# Patient Record
Sex: Female | Born: 1970 | Race: White | Hispanic: No | Marital: Married | Smoking: Never smoker
Health system: Southern US, Community
[De-identification: ages and names within clinical notes are randomized; demographics above are authoritative.]

## PROBLEM LIST (undated history)

## (undated) HISTORY — PX: ABDOMINAL HYSTERECTOMY: SHX81

---

## 1998-03-05 ENCOUNTER — Other Ambulatory Visit: Admission: RE | Admit: 1998-03-05 | Discharge: 1998-03-05 | Payer: Self-pay | Admitting: Gynecology

## 2000-03-21 ENCOUNTER — Other Ambulatory Visit: Admission: RE | Admit: 2000-03-21 | Discharge: 2000-03-21 | Payer: Self-pay | Admitting: Specialist

## 2010-09-20 ENCOUNTER — Encounter: Payer: Self-pay | Admitting: *Deleted

## 2010-09-20 ENCOUNTER — Emergency Department (HOSPITAL_BASED_OUTPATIENT_CLINIC_OR_DEPARTMENT_OTHER)
Admission: EM | Admit: 2010-09-20 | Discharge: 2010-09-20 | Disposition: A | Discharge status: Home / Self Care | Payer: BC Managed Care – PPO | Attending: Emergency Medicine | Admitting: Emergency Medicine

## 2010-09-20 ENCOUNTER — Emergency Department (INDEPENDENT_AMBULATORY_CARE_PROVIDER_SITE_OTHER): Payer: BC Managed Care – PPO

## 2010-09-20 DIAGNOSIS — W19XXXA Unspecified fall, initial encounter: Secondary | ICD-10-CM

## 2010-09-20 DIAGNOSIS — S80219A Abrasion, unspecified knee, initial encounter: Secondary | ICD-10-CM

## 2010-09-20 DIAGNOSIS — W010XXA Fall on same level from slipping, tripping and stumbling without subsequent striking against object, initial encounter: Secondary | ICD-10-CM | POA: Insufficient documentation

## 2010-09-20 DIAGNOSIS — S8000XA Contusion of unspecified knee, initial encounter: Secondary | ICD-10-CM | POA: Insufficient documentation

## 2010-09-20 DIAGNOSIS — M25569 Pain in unspecified knee: Secondary | ICD-10-CM

## 2010-09-20 DIAGNOSIS — IMO0002 Reserved for concepts with insufficient information to code with codable children: Secondary | ICD-10-CM | POA: Insufficient documentation

## 2010-09-20 MED ORDER — HYDROCODONE-ACETAMINOPHEN 5-500 MG PO TABS: 1.0000 | ORAL_TABLET | Freq: Four times a day (QID) | ORAL | Status: AC | PRN

## 2010-09-20 MED ORDER — TETANUS-DIPHTH-ACELL PERTUSSIS 5-2.5-18.5 LF-MCG/0.5 IM SUSP
0.5000 mL | Freq: Once | INTRAMUSCULAR | Status: AC
  Administered 2010-09-20: 0.5 mL via INTRAMUSCULAR
  Filled 2010-09-20: qty 0.5

## 2010-09-20 NOTE — ED Notes (Signed)
Last pm she slipped and fell on concrete. Inj to her right knee. Abrasion noted. Pain is worse the more she tries to walk.

## 2010-09-20 NOTE — ED Provider Notes (Signed)
History     CSN: [id number] Arrival date & time: 09/20/2010  3:35 PM  Chief Complaint  Patient presents with  . Fall   HPI Comments: Slipped on concrete pavers and injured knee.    Patient is a 40 y.o. female presenting with fall. The history is provided by the patient.  Fall The accident occurred yesterday. The fall occurred while walking. She fell from a height of 1 to 2 ft. She landed on concrete. The point of impact was the right knee. The pain is at a severity of 8/10. The pain is moderate. She was ambulatory at the scene. There was no drug use involved in the accident. Pertinent negatives include no fever.    History reviewed. No pertinent past medical history.  Past Surgical History  Procedure Date  . Abdominal hysterectomy     No family history on file.  History  Substance Use Topics  . Smoking status: Never Smoker   . Smokeless tobacco: Not on file  . Alcohol Use: No    OB History    Grav Para Term Preterm Abortions TAB SAB Ect Mult Living                  Review of Systems  Constitutional: Negative for fever and chills.  Musculoskeletal:       As above.  Skin:       Abrasion to right knee.  All other systems reviewed and are negative.    Physical Exam  BP 132/78  Pulse 78  Temp(Src) 98 F (36.7 C) (Oral)  Resp 22  SpO2 99%  Physical Exam  Constitutional: She is oriented to person, place, and time. She appears well-developed and well-nourished.  HENT:  Head: Normocephalic and atraumatic.  Neck: Normal range of motion. Neck supple.  Musculoskeletal:       The right knee is noted to have an abrasion and swelling and ttp to the patella.  Seems stable ap,lat.  Neurological: She is alert and oriented to person, place, and time.  Skin:       Abrasion to the right knee.    ED Course  Procedures  MDM xrays okay.  Will discharge with time, pain meds.      Geoffery Lyons, MD 09/20/10 [phone]

## 2012-11-29 IMAGING — CR DG KNEE COMPLETE 4+V*R*
4 series · 4 of 4 positions shown · non-contrast
Comparison: None

CLINICAL DATA: Fell.  Right knee pain.

RIGHT KNEE - COMPLETE 4+ VIEW

[t knee ap right]
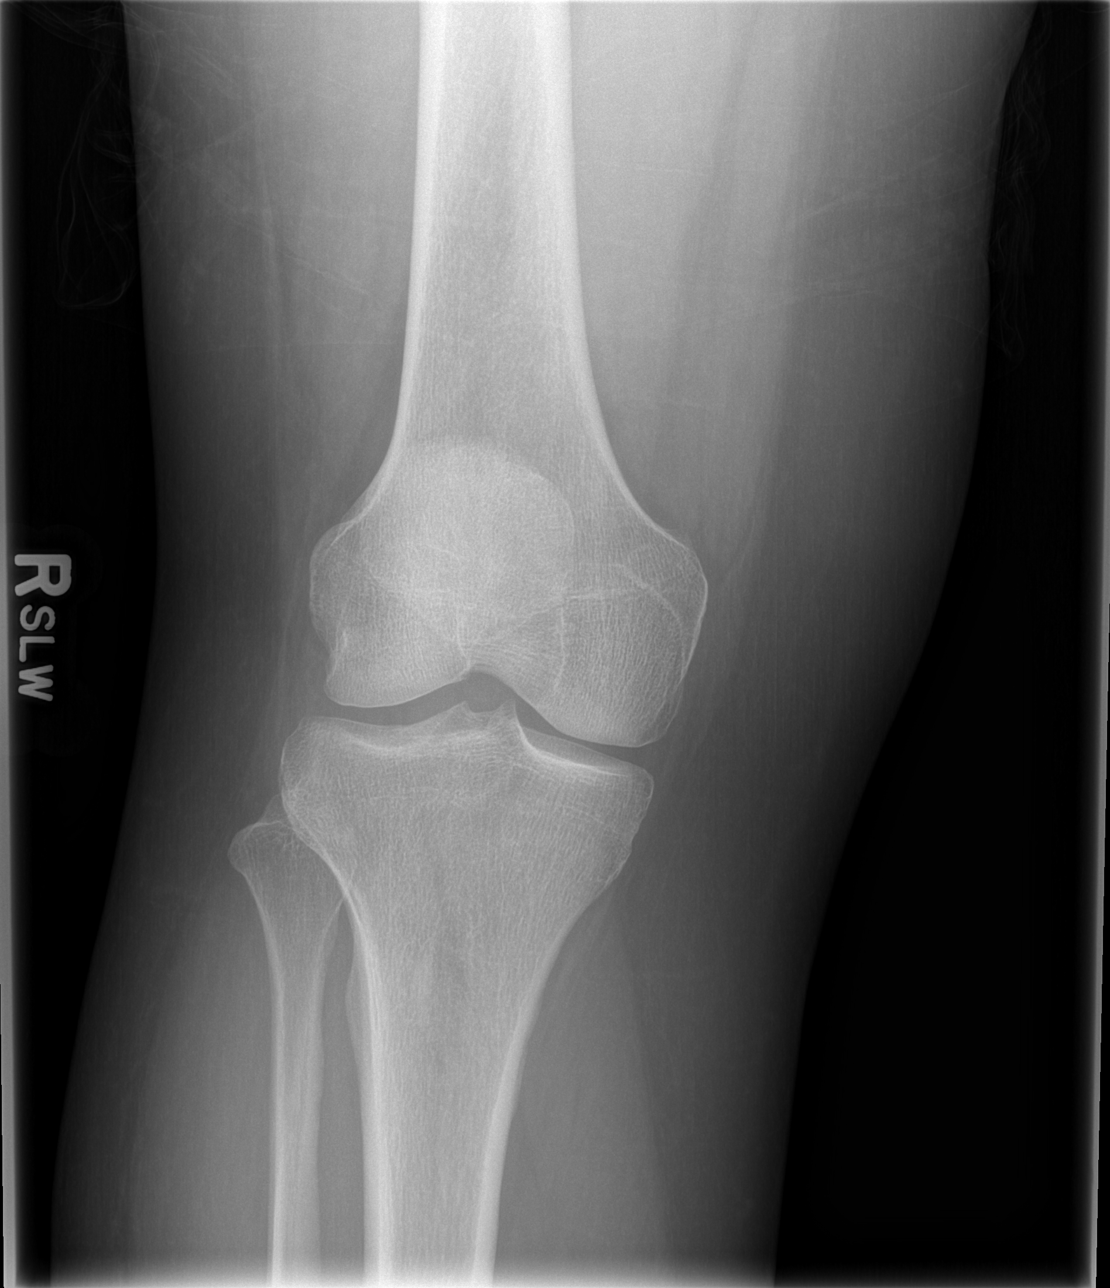

[t knee oblique right (1 of 2)]
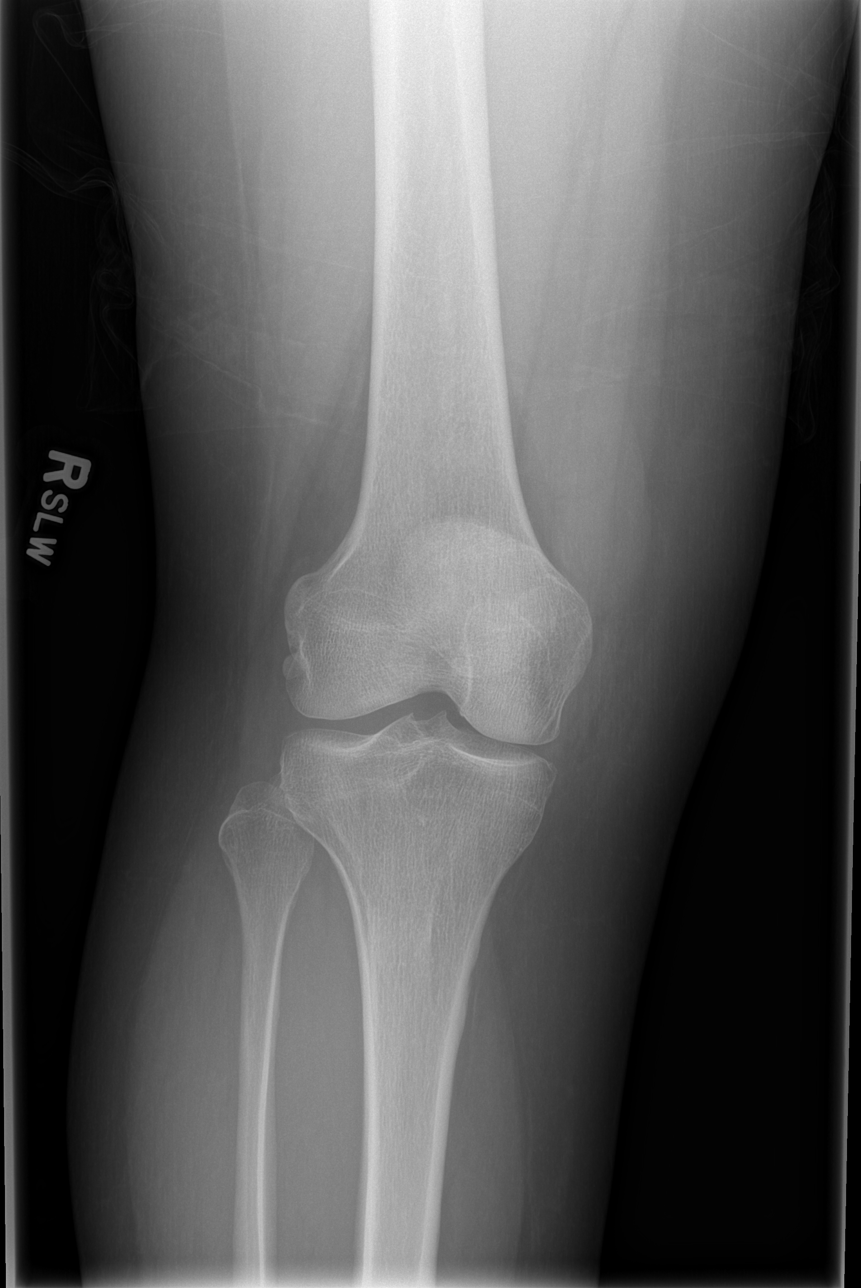

[t knee oblique right (2 of 2)]
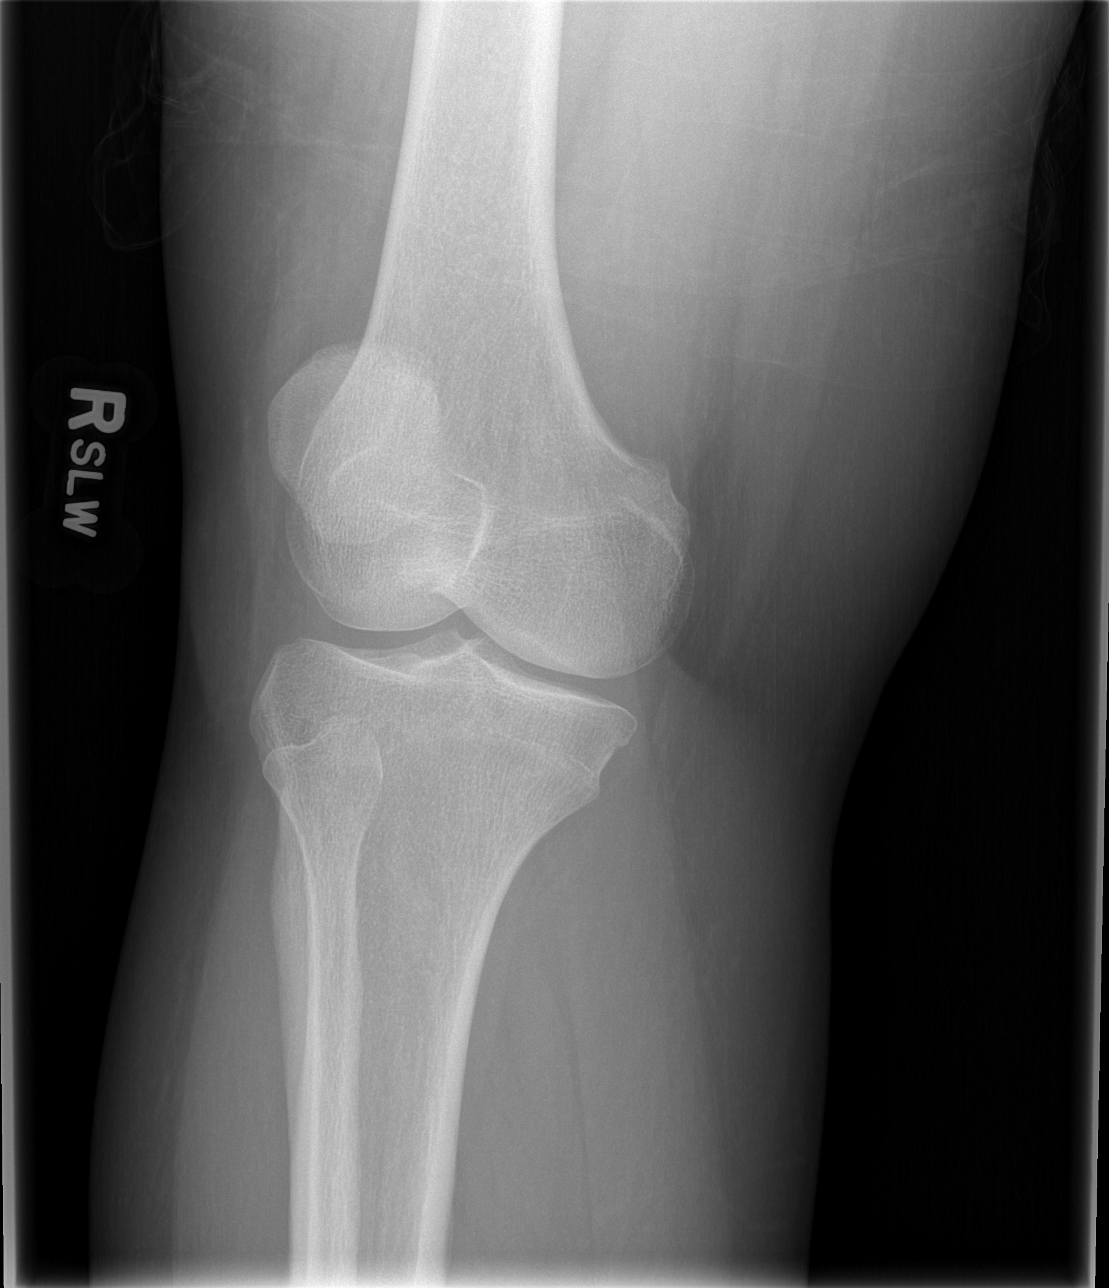

[t knee lat right]
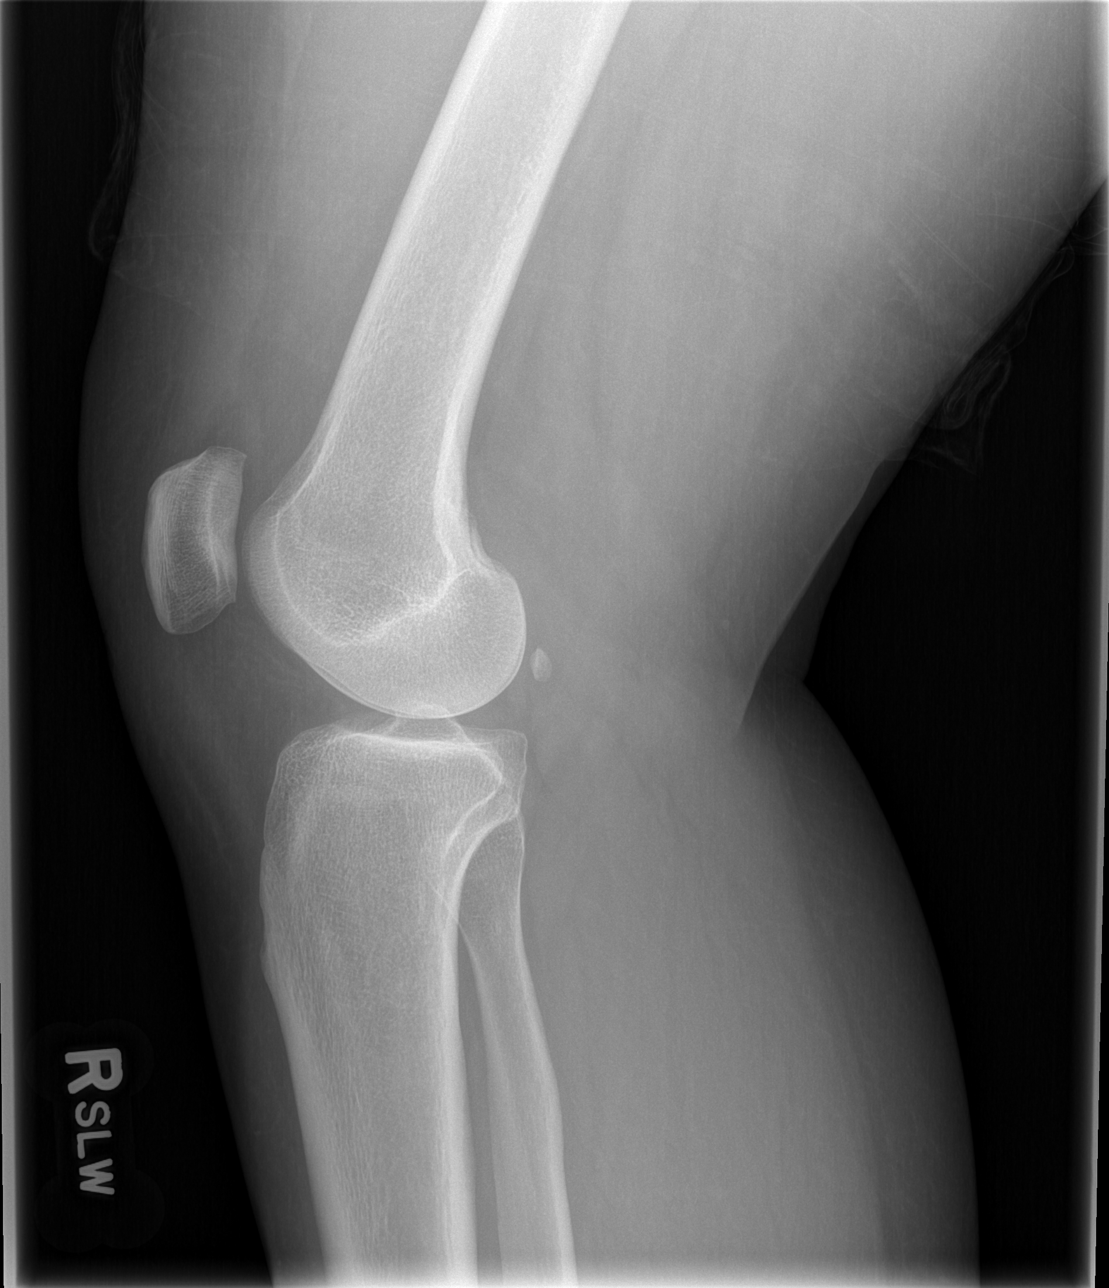

[4 of 4 positions shown; findings below may reference images not displayed]

FINDINGS: The joint spaces are maintained.  No acute bony findings
or osteochondral lesions.  No joint effusion.
IMPRESSION: No acute bony findings or joint effusion.

## 2022-07-14 ENCOUNTER — Other Ambulatory Visit: Payer: Self-pay

## 2022-07-14 ENCOUNTER — Encounter (HOSPITAL_BASED_OUTPATIENT_CLINIC_OR_DEPARTMENT_OTHER): Payer: Self-pay | Admitting: Emergency Medicine

## 2022-07-14 ENCOUNTER — Emergency Department (HOSPITAL_BASED_OUTPATIENT_CLINIC_OR_DEPARTMENT_OTHER)
Admission: EM | Admit: 2022-07-14 | Discharge: 2022-07-14 | Disposition: A | Discharge status: Home / Self Care | Payer: No Typology Code available for payment source | Attending: Emergency Medicine | Admitting: Emergency Medicine

## 2022-07-14 DIAGNOSIS — K649 Unspecified hemorrhoids: Secondary | ICD-10-CM | POA: Diagnosis present

## 2022-07-14 DIAGNOSIS — K644 Residual hemorrhoidal skin tags: Secondary | ICD-10-CM | POA: Insufficient documentation

## 2022-07-14 MED ORDER — HYDROCORT-PRAMOXINE (PERIANAL) 1-1 % EX FOAM: 1.0000 | Freq: Two times a day (BID) | CUTANEOUS | 1 refills | Status: AC

## 2022-07-14 MED ORDER — LIDOCAINE HCL URETHRAL/MUCOSAL 2 % EX GEL
1.0000 | Freq: Once | CUTANEOUS | Status: AC
  Administered 2022-07-14: 1 via TOPICAL
  Filled 2022-07-14: qty 11

## 2022-07-14 NOTE — ED Notes (Signed)
ED MD with this RN at bedside, performed external rectal exam

## 2022-07-14 NOTE — ED Provider Notes (Signed)
[place] Provider Note   CSN: [id number] Arrival date & time: 07/14/22  1332     History  Chief Complaint  Patient presents with   Hemorrhoids    Chelsea Mendoza is a 52 y.o. female presented to ED with complaint of a painful hemorrhoid ongoing for approximately 7 days.  Patient ports has had hemorrhoids in the past but none this painful or persistent.  She has tried over-the-counter Preparation H and witch is hazel with no relief.  She denies constipation.  HPI     Home Medications Prior to Admission medications   Medication Sig Start Date End Date Taking? Authorizing Provider  hydrocortisone-pramoxine [facility]) rectal foam Place 1 applicator rectally 2 (two) times daily for 10 days. 07/14/22 07/24/22 Yes Damarea Merkel, Kermit Balo, MD  celecoxib (CELEBREX) 200 MG capsule Take 200 mg by mouth daily as needed. For inflammation     [provider]  Dexlansoprazole (DEXILANT PO) Take 1 capsule by mouth daily. For acid reflux      [provider]  ibuprofen (ADVIL,MOTRIN) 200 MG tablet Take 200 mg by mouth 2 (two) times daily as needed. For pain      [provider]      Allergies    Codeine and Demerol    Review of Systems   Review of Systems  Physical Exam Updated Vital Signs BP (!) 155/88 (BP Location: Right Arm)   Pulse 92   Temp 97.9 F (36.6 C) (Oral)   Resp 16   SpO2 97%  Physical Exam Constitutional:      General: She is not in acute distress. HENT:     Head: Normocephalic and atraumatic.  Eyes:     Conjunctiva/sclera: Conjunctivae normal.     Pupils: Pupils are equal, round, and reactive to light.  Cardiovascular:     Rate and Rhythm: Normal rate and regular rhythm.  Pulmonary:     Effort: Pulmonary effort is normal. No respiratory distress.  Abdominal:     General: There is no distension.     Tenderness: There is no abdominal tenderness.  Genitourinary:    Comments: GU exam  performed with nurse chaperone present.  The patient has a tender external hemorrhoid with a small region of firmness Skin:    General: Skin is warm and dry.  Neurological:     General: No focal deficit present.     Mental Status: She is alert. Mental status is at baseline.  Psychiatric:        Mood and Affect: Mood normal.        Behavior: Behavior normal.     ED Results / Procedures / Treatments   Labs (all labs ordered are listed, but only abnormal results are displayed) Labs Reviewed - No data to display  EKG None  Radiology No results found.  Procedures Procedures    Medications Ordered in ED Medications  lidocaine (XYLOCAINE) 2 % jelly 1 Application (1 Application Topical Given 07/14/22 1429)    ED Course/ Medical Decision Making/ A&P                             Medical Decision Making Risk Prescription drug management.   Patient is here with an external hemorrhoid.  This may be engorged versus thrombosed.  However this is been ongoing for nearly a week, and therefore there is no indication for an emergent thrombectomy or incision in the ED.  We can try Proctofoam, lidocaine jelly, we discussed sitz bath and colorectal clinic follow up if persistent pain.        Final Clinical Impression(s) / ED Diagnoses Final diagnoses:  Hemorrhoids, unspecified hemorrhoid type    Rx / DC Orders ED Discharge Orders          Ordered    hydrocortisone-pramoxine (PROCTOFOAM-HC) rectal foam  2 times daily        07/14/22 1422              Terald Sleeper, MD 07/15/22 [phone]

## 2022-07-14 NOTE — ED Triage Notes (Signed)
Pt states she has a hemorrhoid which is not get better with OTC products.  Pt states there is a hard knot inside.  No known fevers.  Some bleeding BM

## 2022-07-14 NOTE — Discharge Instructions (Addendum)
I recommend buying a Sitz bath and Epsom salt over the counter at a pharmacy.  You can soak your hemorrhoid in this 2-3 times per day.  If you are not able to use a sitz bath normally, you can also use a warm bathtub for the same effect.  Use stool softeners as needed to keep your stools light.  You can use the lidocaine jelly as needed as a numbing agent before bowel movements.  You should also apply the Proctofoam twice daily to help shrink the hemorrhoid.
